# Patient Record
Sex: Female | Born: 2000 | Race: Black or African American | Hispanic: No | Marital: Single | Smoking: Never smoker
Health system: Southern US, Community
[De-identification: ages and names within clinical notes are randomized; demographics above are authoritative.]

## PROBLEM LIST (undated history)

## (undated) DIAGNOSIS — I471 Supraventricular tachycardia, unspecified: Secondary | ICD-10-CM

## (undated) HISTORY — DX: Supraventricular tachycardia, unspecified: I47.10

## (undated) HISTORY — DX: Supraventricular tachycardia: I47.1

---

## 2019-12-25 ENCOUNTER — Ambulatory Visit: Payer: Self-pay | Attending: Family

## 2019-12-25 DIAGNOSIS — Z23 Encounter for immunization: Secondary | ICD-10-CM

## 2019-12-25 NOTE — Progress Notes (Signed)
   Covid-19 Vaccination Clinic  Name:  Sydney Ramirez    MRN: [id number] DOB: [birth year]  12/25/2019  [name] was observed post Covid-19 immunization for 15 minutes without incident. She was provided with Vaccine Information Sheet and instruction to access the V-Safe system.   [name] was instructed to call 911 with any severe reactions post vaccine: Marland Kitchen Difficulty breathing  . Swelling of face and throat  . A fast heartbeat  . A bad rash all over body  . Dizziness and weakness   Immunizations Administered    Name Date Dose VIS Date Route   Moderna COVID-19 Vaccine 12/25/2019 11:47 AM 0.5 mL 08/26/2019 Intramuscular   Manufacturer: Moderna   Lot: [id number]   NDC: 00511-021-11

## 2020-01-27 ENCOUNTER — Ambulatory Visit: Payer: Self-pay | Attending: Family

## 2020-01-27 DIAGNOSIS — Z23 Encounter for immunization: Secondary | ICD-10-CM

## 2020-01-27 NOTE — Progress Notes (Signed)
   Covid-19 Vaccination Clinic  Name:  Sydney Ramirez    MRN: [id number] DOB: [birth year]  01/27/2020  Ms. Gross was observed post Covid-19 immunization for 15 minutes without incident. She was provided with Vaccine Information Sheet and instruction to access the V-Safe system.   [name] was instructed to call 911 with any severe reactions post vaccine: Marland Kitchen Difficulty breathing  . Swelling of face and throat  . A fast heartbeat  . A bad rash all over body  . Dizziness and weakness   Immunizations Administered    Name Date Dose VIS Date Route   Moderna COVID-19 Vaccine 01/27/2020 11:07 AM 0.5 mL 08/2019 Intramuscular   Manufacturer: Moderna   Lot: [id number]   NDC: 77824-235-36

## 2020-06-09 ENCOUNTER — Other Ambulatory Visit: Payer: Self-pay | Admitting: Nephrology

## 2020-06-09 DIAGNOSIS — R809 Proteinuria, unspecified: Secondary | ICD-10-CM

## 2020-06-09 DIAGNOSIS — Z841 Family history of disorders of kidney and ureter: Secondary | ICD-10-CM

## 2020-06-17 ENCOUNTER — Ambulatory Visit
Admission: RE | Admit: 2020-06-17 | Discharge: 2020-06-17 | Disposition: A | Discharge status: Home / Self Care | Payer: Self-pay | Source: Ambulatory Visit | Attending: Nephrology | Admitting: Nephrology

## 2020-06-17 DIAGNOSIS — Z841 Family history of disorders of kidney and ureter: Secondary | ICD-10-CM

## 2020-06-17 DIAGNOSIS — R809 Proteinuria, unspecified: Secondary | ICD-10-CM

## 2020-10-20 ENCOUNTER — Ambulatory Visit: Payer: BC Managed Care – PPO | Attending: Family

## 2020-10-20 DIAGNOSIS — Z23 Encounter for immunization: Secondary | ICD-10-CM

## 2020-11-20 ENCOUNTER — Emergency Department (HOSPITAL_COMMUNITY): Payer: BC Managed Care – PPO

## 2020-11-20 ENCOUNTER — Other Ambulatory Visit: Payer: Self-pay

## 2020-11-20 ENCOUNTER — Encounter (HOSPITAL_COMMUNITY): Payer: Self-pay | Admitting: Emergency Medicine

## 2020-11-20 ENCOUNTER — Emergency Department (HOSPITAL_COMMUNITY)
Admission: EM | Admit: 2020-11-20 | Discharge: 2020-11-20 | Disposition: A | Discharge status: Home / Self Care | Payer: BC Managed Care – PPO | Attending: Emergency Medicine | Admitting: Emergency Medicine

## 2020-11-20 DIAGNOSIS — R079 Chest pain, unspecified: Secondary | ICD-10-CM | POA: Diagnosis present

## 2020-11-20 DIAGNOSIS — I471 Supraventricular tachycardia: Secondary | ICD-10-CM | POA: Insufficient documentation

## 2020-11-20 DIAGNOSIS — R9431 Abnormal electrocardiogram [ECG] [EKG]: Secondary | ICD-10-CM

## 2020-11-20 LAB — BASIC METABOLIC PANEL
Anion gap: 15 (ref 5–15)
BUN: 10 mg/dL (ref 6–20)
CO2: 17 mmol/L — ABNORMAL LOW (ref 22–32)
Calcium: 9.3 mg/dL (ref 8.9–10.3)
Chloride: 104 mmol/L (ref 98–111)
Creatinine, Ser: 1.09 mg/dL — ABNORMAL HIGH (ref 0.44–1.00)
GFR, Estimated: >60 mL/min (ref >60)
Glucose, Bld: 99 mg/dL (ref 70–99)
Potassium: 3.8 mmol/L (ref 3.5–5.1)
Sodium: 136 mmol/L (ref 135–145)

## 2020-11-20 LAB — CBC
HCT: 41.9 % (ref 36.0–46.0)
Hemoglobin: 13.5 g/dL (ref 12.0–15.0)
MCH: 28.5 pg (ref 26.0–34.0)
MCHC: 32.2 g/dL (ref 30.0–36.0)
MCV: 88.4 fL (ref 80.0–100.0)
Platelets: 367 10*3/uL (ref 150–400)
RBC: 4.74 MIL/uL (ref 3.87–5.11)
RDW: 14 % (ref 11.5–15.5)
WBC: 9.3 10*3/uL (ref 4.0–10.5)
nRBC: 0 % (ref 0.0–0.2)

## 2020-11-20 LAB — TSH: TSH: 2.252 u[IU]/mL (ref 0.350–4.500)

## 2020-11-20 LAB — I-STAT BETA HCG BLOOD, ED (MC, WL, AP ONLY): I-stat hCG, quantitative: <5 m[IU]/mL (ref <5)

## 2020-11-20 LAB — TROPONIN I (HIGH SENSITIVITY): Troponin I (High Sensitivity): 16 ng/L (ref <18)

## 2020-11-20 MED ORDER — ADENOSINE 6 MG/2ML IV SOLN
INTRAVENOUS | Status: AC
  Filled 2020-11-20: qty 4

## 2020-11-20 MED ORDER — ADENOSINE 6 MG/2ML IV SOLN
6.0000 mg | Freq: Once | INTRAVENOUS | Status: DC
  Filled 2020-11-20: qty 2

## 2020-11-20 NOTE — Discharge Instructions (Addendum)
It was our pleasure to provide your ER care today - we hope that you feel better.  Minimize caffeine use.   Follow up with cardiologist in the coming week - call office Monday AM to arrange appointment.   Return to ER right away if worse, new symptoms, fevers, chest pain, trouble breathing, persistent fast heart beat, fainting, or other concern.

## 2020-11-20 NOTE — ED Notes (Addendum)
Pt resting comfortably. Respirations even and unlabored.  

## 2020-11-20 NOTE — ED Provider Notes (Signed)
[facility] EMERGENCY DEPARTMENT Provider Note   CSN: [id number] Arrival date & time: 11/20/20  0827     History Chief Complaint  Patient presents with  . Chest Pain  . SVT    Sydney Ramirez is a 20 y.o. female.  Patient c/o acute onset palpitations this AM, at rest, moderate-severe, constant, persistent. No syncope.  Felt mildly tight in chest and mildly sob. Denies hx similar symptoms in past. No hx syncope. States recently started exercising, and denies any symptoms with exercise. Denies personal or fam hx heart disease/problems. States yesterday felt fine, normal day. Normal po intake. No recent wt change. No heat intolerance or sweats. No fevers. No hx thyroid disease or other medical illness. Denies any drug use. No heavy caffeine use or energy drink use. Denies leg pain or swelling.   The history is provided by the patient.  Chest Pain Associated symptoms: palpitations   Associated symptoms: no abdominal pain, no back pain, no cough, no fever, no headache and no vomiting        History reviewed. No pertinent past medical history.  There are no problems to display for this patient.   History reviewed. No pertinent surgical history.   OB History   No obstetric history on file.     No family history on file.  Social History   Tobacco Use  . Smoking status: Never Smoker  . Smokeless tobacco: Never Used  Substance Use Topics  . Alcohol use: Not Currently  . Drug use: Not Currently    Home Medications Prior to Admission medications   Not on File    Allergies    Patient has no allergy information on record.  Review of Systems   Review of Systems  Constitutional: Negative for chills and fever.  HENT: Negative for sore throat.   Eyes: Negative for visual disturbance.  Respiratory: Negative for cough.   Cardiovascular: Positive for chest pain and palpitations. Negative for leg swelling.  Gastrointestinal: Negative for abdominal pain,  blood in stool, diarrhea and vomiting.  Genitourinary: Negative for flank pain and vaginal bleeding.  Musculoskeletal: Negative for back pain and neck pain.  Skin: Negative for rash.  Neurological: Negative for syncope and headaches.  Hematological: Does not bruise/bleed easily.  Psychiatric/Behavioral: Negative for confusion.    Physical Exam Updated Vital Signs BP 90/64   Pulse 70   Temp 98.6 F (37 C) (Oral)   Resp 15   SpO2 100%   Physical Exam Vitals and nursing note reviewed.  Constitutional:      Appearance: Normal appearance. She is well-developed.  HENT:     Head: Atraumatic.     Nose: Nose normal.     Mouth/Throat:     Mouth: Mucous membranes are moist.  Eyes:     General: No scleral icterus.    Conjunctiva/sclera: Conjunctivae normal.     Pupils: Pupils are equal, round, and reactive to light.  Neck:     Trachea: No tracheal deviation.     Comments: Thyroid not grossly enlarged or tender.  Cardiovascular:     Rate and Rhythm: Regular rhythm. Tachycardia present.     Pulses: Normal pulses.     Heart sounds: Normal heart sounds. No murmur heard. No friction rub. No gallop.   Pulmonary:     Effort: Pulmonary effort is normal. No respiratory distress.     Breath sounds: Normal breath sounds.  Abdominal:     General: Bowel sounds are normal. There is no distension.  Palpations: Abdomen is soft.     Tenderness: There is no abdominal tenderness. There is no guarding.  Genitourinary:    Comments: No cva tenderness.  Musculoskeletal:        General: No swelling or tenderness.     Cervical back: Normal range of motion and neck supple. No rigidity. No muscular tenderness.     Right lower leg: No edema.     Left lower leg: No edema.  Skin:    General: Skin is warm and dry.     Findings: No rash.  Neurological:     Mental Status: She is alert.     Comments: Alert, speech normal. Steady gait.   Psychiatric:        Mood and Affect: Mood normal.     ED  Results / Procedures / Treatments   Labs (all labs ordered are listed, but only abnormal results are displayed) Results for orders placed or performed during the hospital encounter of 11/20/20  Basic metabolic panel  Result Value Ref Range   Sodium 136 135 - 145 mmol/L   Potassium 3.8 3.5 - 5.1 mmol/L   Chloride 104 98 - 111 mmol/L   CO2 17 (L) 22 - 32 mmol/L   Glucose, Bld 99 70 - 99 mg/dL   BUN 10 6 - 20 mg/dL   Creatinine, Ser 6.29 (H) 0.44 - 1.00 mg/dL   Calcium 9.3 8.9 - 52.8 mg/dL   GFR, Estimated >41 >32 mL/min   Anion gap 15 5 - 15  CBC  Result Value Ref Range   WBC 9.3 4.0 - 10.5 K/uL   RBC 4.74 3.87 - 5.11 MIL/uL   Hemoglobin 13.5 12.0 - 15.0 g/dL   HCT 44.0 10.2 - 72.5 %   MCV 88.4 80.0 - 100.0 fL   MCH 28.5 26.0 - 34.0 pg   MCHC 32.2 30.0 - 36.0 g/dL   RDW 36.6 44.0 - 34.7 %   Platelets 367 150 - 400 K/uL   nRBC 0.0 0.0 - 0.2 %  TSH  Result Value Ref Range   TSH 2.252 0.350 - 4.500 uIU/mL  I-Stat beta hCG blood, ED  Result Value Ref Range   I-stat hCG, quantitative <5.0 <5 mIU/mL   Comment 3          Troponin I (High Sensitivity)  Result Value Ref Range   Troponin I (High Sensitivity) 16 <18 ng/L   DG Chest Portable 1 View  Result Date: 11/20/2020 CLINICAL DATA:  Left-sided chest pain and shortness of breath with super intra-ocular tachycardia. EXAM: PORTABLE CHEST 1 VIEW COMPARISON:  None. FINDINGS: The heart size and mediastinal contours are within normal limits. No focal consolidation. No pleural effusion. No pneumothorax. The visualized skeletal structures are unremarkable. Defibrillator lead projects over left chest. IMPRESSION: No active disease. Electronically Signed   By: Maudry Mayhew MD   On: 11/20/2020 09:05    EKG EKG Interpretation  Date/Time:  Saturday November 20 2020 08:48:07 EST Ventricular Rate:  64 PR Interval:    QRS Duration: 77 QT Interval:  322 QTC Calculation: 333 R Axis:   72 Text Interpretation: Sinus rhythm Borderline short  PR interval Nonspecific ST abnormality Short QT interval Confirmed by Cathren Laine ([id number]) on 11/20/2020 9:02:49 AM   Radiology DG Chest Portable 1 View  Result Date: 11/20/2020 CLINICAL DATA:  Left-sided chest pain and shortness of breath with super intra-ocular tachycardia. EXAM: PORTABLE CHEST 1 VIEW COMPARISON:  None. FINDINGS: The heart size and mediastinal contours are within  normal limits. No focal consolidation. No pleural effusion. No pneumothorax. The visualized skeletal structures are unremarkable. Defibrillator lead projects over left chest. IMPRESSION: No active disease. Electronically Signed   By: Maudry Mayhew MD   On: 11/20/2020 09:05    Procedures Procedures   Medications Ordered in ED Medications  adenosine (ADENOCARD) 6 MG/2ML injection 6 mg (has no administration in time range)  adenosine (ADENOCARD) 6 MG/2ML injection (has no administration in time range)    ED Course  I have reviewed the triage vital signs and the nursing notes.  Pertinent labs & imaging results that were available during my care of the patient were reviewed by me and considered in my medical decision making (see chart for details).    MDM Rules/Calculators/A&P                         Iv ns. Adenosine ordered. Stat labs. Continuous pulse ox and cardiac monitoring.   Reviewed nursing notes and prior charts for additional history.   Prior to adenosine being given, vagal maneuvers tried with conversation to sinus rhythm, and resolution of palpitations and all symptoms.   Labs reviewed/interpreted by me - k normal, tsh normal, trop normal.  CXR reviewed/interpreted by me - no cm, no pna.   Recheck pt, remains asymptomatic in NSR. No cp or sob. No faintness or dizziness. Po fluids. Ambulate in hall.  Pt asymptomatic and appears stable for d/c.   Rec cardiology f/u in coming week.  Return precautions provided.       Final Clinical Impression(s) / ED Diagnoses Final diagnoses:  None     Rx / DC Orders ED Discharge Orders    None       Cathren Laine, MD 11/20/20 1103

## 2020-11-20 NOTE — ED Triage Notes (Signed)
Pt reports L sided chest pain and SOB that woke her up at 6:30am.  SVT @ 223.

## 2020-11-20 NOTE — ED Notes (Signed)
Patient Alert and oriented to baseline. Stable and ambulatory to baseline. Patient verbalized understanding of the discharge instructions.  Patient belongings were taken by the patient.   

## 2020-11-20 NOTE — ED Notes (Signed)
Pt given graham crackers and apple juice  

## 2020-11-20 NOTE — ED Notes (Signed)
Pt attempted vagal maneuvers with conversion to NSR. ekg captured.

## 2020-11-23 ENCOUNTER — Ambulatory Visit (INDEPENDENT_AMBULATORY_CARE_PROVIDER_SITE_OTHER): Payer: BC Managed Care – PPO | Admitting: Cardiology

## 2020-11-23 ENCOUNTER — Encounter: Payer: Self-pay | Admitting: Cardiology

## 2020-11-23 ENCOUNTER — Other Ambulatory Visit: Payer: Self-pay

## 2020-11-23 VITALS — BP 110/60 | HR 90 | Ht 64.0 in | Wt 159.4 lb

## 2020-11-23 DIAGNOSIS — I471 Supraventricular tachycardia, unspecified: Secondary | ICD-10-CM | POA: Insufficient documentation

## 2020-11-23 NOTE — Patient Instructions (Signed)
Medication Instructions:  Your physician recommends that you continue on your current medications as directed. Please refer to the Current Medication list given to you today.  *If you need a refill on your cardiac medications before your next appointment, please call your pharmacy*  Testing/Procedures: Your physician has requested that you have an echocardiogram. Echocardiography is a painless test that uses sound waves to create images of your heart. It provides your doctor with information about the size and shape of your heart and how well your heart's chambers and valves are working. This procedure takes approximately one hour. There are no restrictions for this procedure.  Follow-Up: At [facility], you and your health needs are our priority.  As part of our continuing mission to provide you with exceptional heart care, we have created designated Provider Care Teams.  These Care Teams include your primary Cardiologist (physician) and Advanced Practice Providers (APPs -  Physician Assistants and Nurse Practitioners) who all work together to provide you with the care you need, when you need it.    Follow up with Dr. Mayford Knife as needed based on results of testing   Other Instructions You have been referred to see an electrophysiologist, Dr. Lalla Brothers

## 2020-11-23 NOTE — Addendum Note (Signed)
Addended by: Theresia Majors on: 11/23/2020 09:15 AM   Modules accepted: Orders

## 2020-11-23 NOTE — Progress Notes (Signed)
Cardiology Consult  Note    Date:  11/23/2020   ID:  [name], DOB Feb 22, 2001, MRN [id number]  PCP:  Patient, No Pcp Per  Cardiologist:  Armanda Magic, MD   Chief Complaint  Patient presents with  . New Patient (Initial Visit)    SVT    History of Present Illness:  Jenissa Tyrell is a 20 y.o. female who is being seen today for the evaluation of SVT at the request of Cathren Laine, MD.  This is a 20yo female with no PMH who presented to [facility] ER with complaints of palpitations and was dx with SVT.  She had sudden onset of palpitations associated with mild chest tightness and SOB.  She has never had this before.  She exercises with no problems.  No hx of thyroid disease and denies any ETOH use, tobacco, caffeine or energy drink use.  TSH was normal and other labs were normal as well.  EKG showed SVT at 224bpm.  Vagal maneuvers were tried and she spontaneously converted to NSR. After conversion to NSR her EKG showed NSR with borderline short PR interval.    Past Medical History:  Diagnosis Date  . SVT (supraventricular tachycardia) (HCC)     No past surgical history on file.  Current Medications: Current Meds  Medication Sig  . Cholecalciferol (VITAMIN D3 PO) Take 1 tablet by mouth daily.  Kathrynn Running Estrad-Fe Biphas (LO LOESTRIN FE PO) Take 1 tablet by mouth daily.    Allergies:   Iodine and Shellfish allergy   Social History   Socioeconomic History  . Marital status: Single    Spouse name: Not on file  . Number of children: Not on file  . Years of education: Not on file  . Highest education level: Not on file  Occupational History  . Not on file  Tobacco Use  . Smoking status: Never Smoker  . Smokeless tobacco: Never Used  Substance and Sexual Activity  . Alcohol use: Not Currently  . Drug use: Not Currently  . Sexual activity: Not on file  Other Topics Concern  . Not on file  Social History Narrative  . Not on file   Social Determinants of Health    Financial Resource Strain: Not on file  Food Insecurity: Not on file  Transportation Needs: Not on file  Physical Activity: Not on file  Stress: Not on file  Social Connections: Not on file     Family History:  The patient's family history is not on file.   ROS:   Please see the history of present illness.    ROS All other systems reviewed and are negative.  No flowsheet data found.  PHYSICAL EXAM:   VS:  BP 110/60   Pulse 90   Ht 5\' 4"  (1.626 m)   Wt 159 lb 6.4 oz (72.3 kg)   SpO2 98%   BMI 27.36 kg/m    GEN: Well nourished, well developed, in no acute distress  HEENT: normal  Neck: no JVD, carotid bruits, or masses Cardiac: RRR; no murmurs, rubs, or gallops,no edema.  Intact distal pulses bilaterally.  Respiratory:  clear to auscultation bilaterally, normal work of breathing GI: soft, nontender, nondistended, + BS MS: no deformity or atrophy  Skin: warm and dry, no rash Neuro:  Alert and Oriented x 3, Strength and sensation are intact Psych: euthymic mood, full affect  Wt Readings from Last 3 Encounters:  11/23/20 159 lb 6.4 oz (72.3 kg)     Studies/Labs Reviewed:  EKG:  EKG is not ordered today.   Recent Labs: 11/20/2020: BUN 10; Creatinine, Ser 1.09; Hemoglobin 13.5; Platelets 367; Potassium 3.8; Sodium 136; TSH 2.252   Lipid Panel No results found for: CHOL, TRIG, HDL, CHOLHDL, VLDL, LDLCALC, LDLDIRECT   Additional studies/ records that were reviewed today include:  ER notes, labs and EKG  ASSESSMENT:    1. SVT (supraventricular tachycardia) (HCC)      PLAN:  In order of problems listed above:  1. SVT -EKG on arrival in ER consistent with SVT -terminated with vagal maneuvers -I discussed the different vagal maneuvers she could use to terminate this -will refer to Dr. Lalla Brothers with EP to follow as ablation may be considered since BP is soft and her BP was in the upper 90's in the ER   Medication Adjustments/Labs and Tests Ordered: Current  medicines are reviewed at length with the patient today.  Concerns regarding medicines are outlined above.  Medication changes, Labs and Tests ordered today are listed in the Patient Instructions below.  There are no Patient Instructions on file for this visit.   Signed, Armanda Magic, MD  11/23/2020 9:06 AM    [facility] [address] [place], [place], [place]  [address] Phone: [phone]; Fax: [phone]

## 2020-12-16 ENCOUNTER — Ambulatory Visit (HOSPITAL_COMMUNITY): Payer: BC Managed Care – PPO | Attending: Cardiovascular Disease

## 2020-12-16 ENCOUNTER — Other Ambulatory Visit: Payer: Self-pay

## 2020-12-16 DIAGNOSIS — I471 Supraventricular tachycardia: Secondary | ICD-10-CM | POA: Insufficient documentation

## 2020-12-16 LAB — ECHOCARDIOGRAM COMPLETE
Area-P 1/2: 3.99 cm2
S' Lateral: 2.8 cm

## 2020-12-28 ENCOUNTER — Encounter: Payer: Self-pay | Admitting: Cardiology

## 2020-12-28 ENCOUNTER — Ambulatory Visit (INDEPENDENT_AMBULATORY_CARE_PROVIDER_SITE_OTHER): Payer: BC Managed Care – PPO | Admitting: Cardiology

## 2020-12-28 ENCOUNTER — Other Ambulatory Visit: Payer: Self-pay

## 2020-12-28 VITALS — BP 98/62 | HR 80 | Ht 64.0 in | Wt 157.2 lb

## 2020-12-28 DIAGNOSIS — I471 Supraventricular tachycardia: Secondary | ICD-10-CM | POA: Diagnosis not present

## 2020-12-28 NOTE — Progress Notes (Addendum)
Electrophysiology Office Note:    Date:  12/28/2020   ID:  Sydney Ramirez, DOB 04/30/2001, MRN [id number]  PCP:  Patient, No Pcp Per (Inactive)  [facility] Cardiologist:  Armanda Magic, MD  [facility] Electrophysiologist:  None   Referring MD: Quintella Reichert, MD   Chief Complaint: SVT  History of Present Illness:    Sydney Ramirez is a 20 y.o. female who presents for an evaluation of SVT at the request of Dr. Mayford Knife.  She has no significant past medical history.  She was seen in the emergency department February 20 6-20 22 with SVT that ultimately converted with vagal maneuvers.  Patient tells me that she was awakened from sleep with chest discomfort and a rapid heartbeat.  She went to urgent care who then referred her to the emergency department where she was found to be in SVT.  She was instructed on how to perform a vagal maneuver which readily terminated the rhythm.  She has not had a recurrence since that time.  There is no history of sudden cardiac death in her family.  No drowning.  No defibrillators.  Her father was on the phone during today's appointment.   Past Medical History:  Diagnosis Date  . SVT (supraventricular tachycardia) (HCC)     No past surgical history on file.  Current Medications: Current Meds  Medication Sig  . Cholecalciferol (VITAMIN D3 PO) Take 1 tablet by mouth daily.  Kathrynn Running Estrad-Fe Biphas (LO LOESTRIN FE PO) Take 1 tablet by mouth daily.     Allergies:   Iodine and Shellfish allergy   Social History   Socioeconomic History  . Marital status: Single    Spouse name: Not on file  . Number of children: Not on file  . Years of education: Not on file  . Highest education level: Not on file  Occupational History  . Not on file  Tobacco Use  . Smoking status: Never Smoker  . Smokeless tobacco: Never Used  Substance and Sexual Activity  . Alcohol use: Not Currently  . Drug use: Not Currently  . Sexual activity: Not  on file  Other Topics Concern  . Not on file  Social History Narrative  . Not on file   Social Determinants of Health   Financial Resource Strain: Not on file  Food Insecurity: Not on file  Transportation Needs: Not on file  Physical Activity: Not on file  Stress: Not on file  Social Connections: Not on file     Family History: The patient's family history is not on file.  ROS:   Please see the history of present illness.    All other systems reviewed and are negative.  EKGs/Labs/Other Studies Reviewed:    The following studies were reviewed today:  December 16, 2020 echo personally reviewed Left ventricular function normal, 60% Right ventricular function normal No significant valvular abnormalities  November 20, 2020 EKG Sinus rhythm.  PR is short.  There is a suggestion of subtle preexcitation in the precordium and inferior leads.  Delta wave appears nearly isoelectric in lead V1 and lead I.  It is positive in the inferior leads with R greater than S in lead III.   November 20, 2020 arrival EKG Narrow complex tachycardia        EKG:  The ekg ordered today demonstrates sinus rhythm with a ventricular rate of 80 bpm.  There is a clear PR isoelectric segment on today's EK.  There is very subtle preexcitation in the  inferior leads.  Preexcitation is significantly less than the EKG on February 26.  Recent Labs: 11/20/2020: BUN 10; Creatinine, Ser 1.09; Hemoglobin 13.5; Platelets 367; Potassium 3.8; Sodium 136; TSH 2.252  Recent Lipid Panel No results found for: CHOL, TRIG, HDL, CHOLHDL, VLDL, LDLCALC, LDLDIRECT  Physical Exam:    VS:  BP 98/62   Pulse 80   Ht 5\' 4"  (1.626 m)   Wt 157 lb 3.2 oz (71.3 kg)   SpO2 97%   BMI 26.98 kg/m     Wt Readings from Last 3 Encounters:  12/28/20 157 lb 3.2 oz (71.3 kg)  11/23/20 159 lb 6.4 oz (72.3 kg)     GEN:  Well nourished, well developed in no acute distress HEENT: Normal NECK: No JVD; No carotid bruits LYMPHATICS:  No lymphadenopathy CARDIAC: RRR, no murmurs, rubs, gallops RESPIRATORY:  Clear to auscultation without rales, wheezing or rhonchi  ABDOMEN: Soft, non-tender, non-distended MUSCULOSKELETAL:  No edema; No deformity  SKIN: Warm and dry NEUROLOGIC:  Alert and oriented x 3 PSYCHIATRIC:  Normal affect   ASSESSMENT:    1. SVT (supraventricular tachycardia) (HCC)    PLAN:    In order of problems listed above:  1. Narrow complex tachycardia Responsive to vagal maneuvers.  There is a suggestion of preexcitation on her baseline EKG.  The delta wave morphology suggest an anteroseptal location.  Differential for her SVT includes AVNRT with bystander accessory pathway versus an orthodromic AVRT.  I discussed management options for her SVT including conservative management versus EP study and ablation.  Given the likely location of her accessory pathway in the anterior septum and the intermittent preexcitation now observed on her EKGs, I think a more conservative approach is justified.  We will plan to touch base in about 6 months.  If she begins having more episodes of SVT she has a way to reach out to our clinic and we will bring her in sooner for an appointment to consider medical therapy versus EP study with possible ablation.     Total time spent with patient today 55 minutes. This includes reviewing records, evaluating the patient and coordinating care.  Medication Adjustments/Labs and Tests Ordered: Current medicines are reviewed at length with the patient today.  Concerns regarding medicines are outlined above.  Orders Placed This Encounter  Procedures  . EKG 12-Lead   No orders of the defined types were placed in this encounter.    Signed, 01/23/21, MD, [facility]  12/28/2020 9:31 AM    Electrophysiology [facility]

## 2020-12-28 NOTE — Patient Instructions (Addendum)
Medication Instructions:  Your physician recommends that you continue on your current medications as directed. Please refer to the Current Medication list given to you today.  Labwork: None ordered.  Testing/Procedures: None ordered.  Follow-Up: Your physician wants you to follow-up in: 6 months with Cameron Lambert, MD   Any Other Special Instructions Will Be Listed Below (If Applicable).  If you need a refill on your cardiac medications before your next appointment, please call your pharmacy.         

## 2021-02-10 NOTE — Progress Notes (Signed)
   Covid-19 Vaccination Clinic  Name:  Sydney Ramirez    MRN: [id number] DOB: 15-Jul-2001  02/10/2021  [name] was observed post Covid-19 immunization for 15 minutes without incident. She was provided with Vaccine Information Sheet and instruction to access the V-Safe system.   [name] was instructed to call 911 with any severe reactions post vaccine: Marland Kitchen Difficulty breathing  . Swelling of face and throat  . A fast heartbeat  . A bad rash all over body  . Dizziness and weakness   Immunizations Administered    Name Date Dose VIS Date Route   Moderna Covid-19 Booster Vaccine 10/20/2020 10:15 AM 0.25 mL 07/14/2020 Intramuscular   Manufacturer: Moderna   Lot: [id number]   NDC: 62229-798-92

## 2022-04-24 IMAGING — DX DG CHEST 1V PORT
1 series · 1 of 1 positions shown · non-contrast
Comparison: None.

CLINICAL DATA: Left-sided chest pain and shortness of breath with
super intra-ocular tachycardia.

EXAM:
PORTABLE CHEST 1 VIEW

[chest ap]
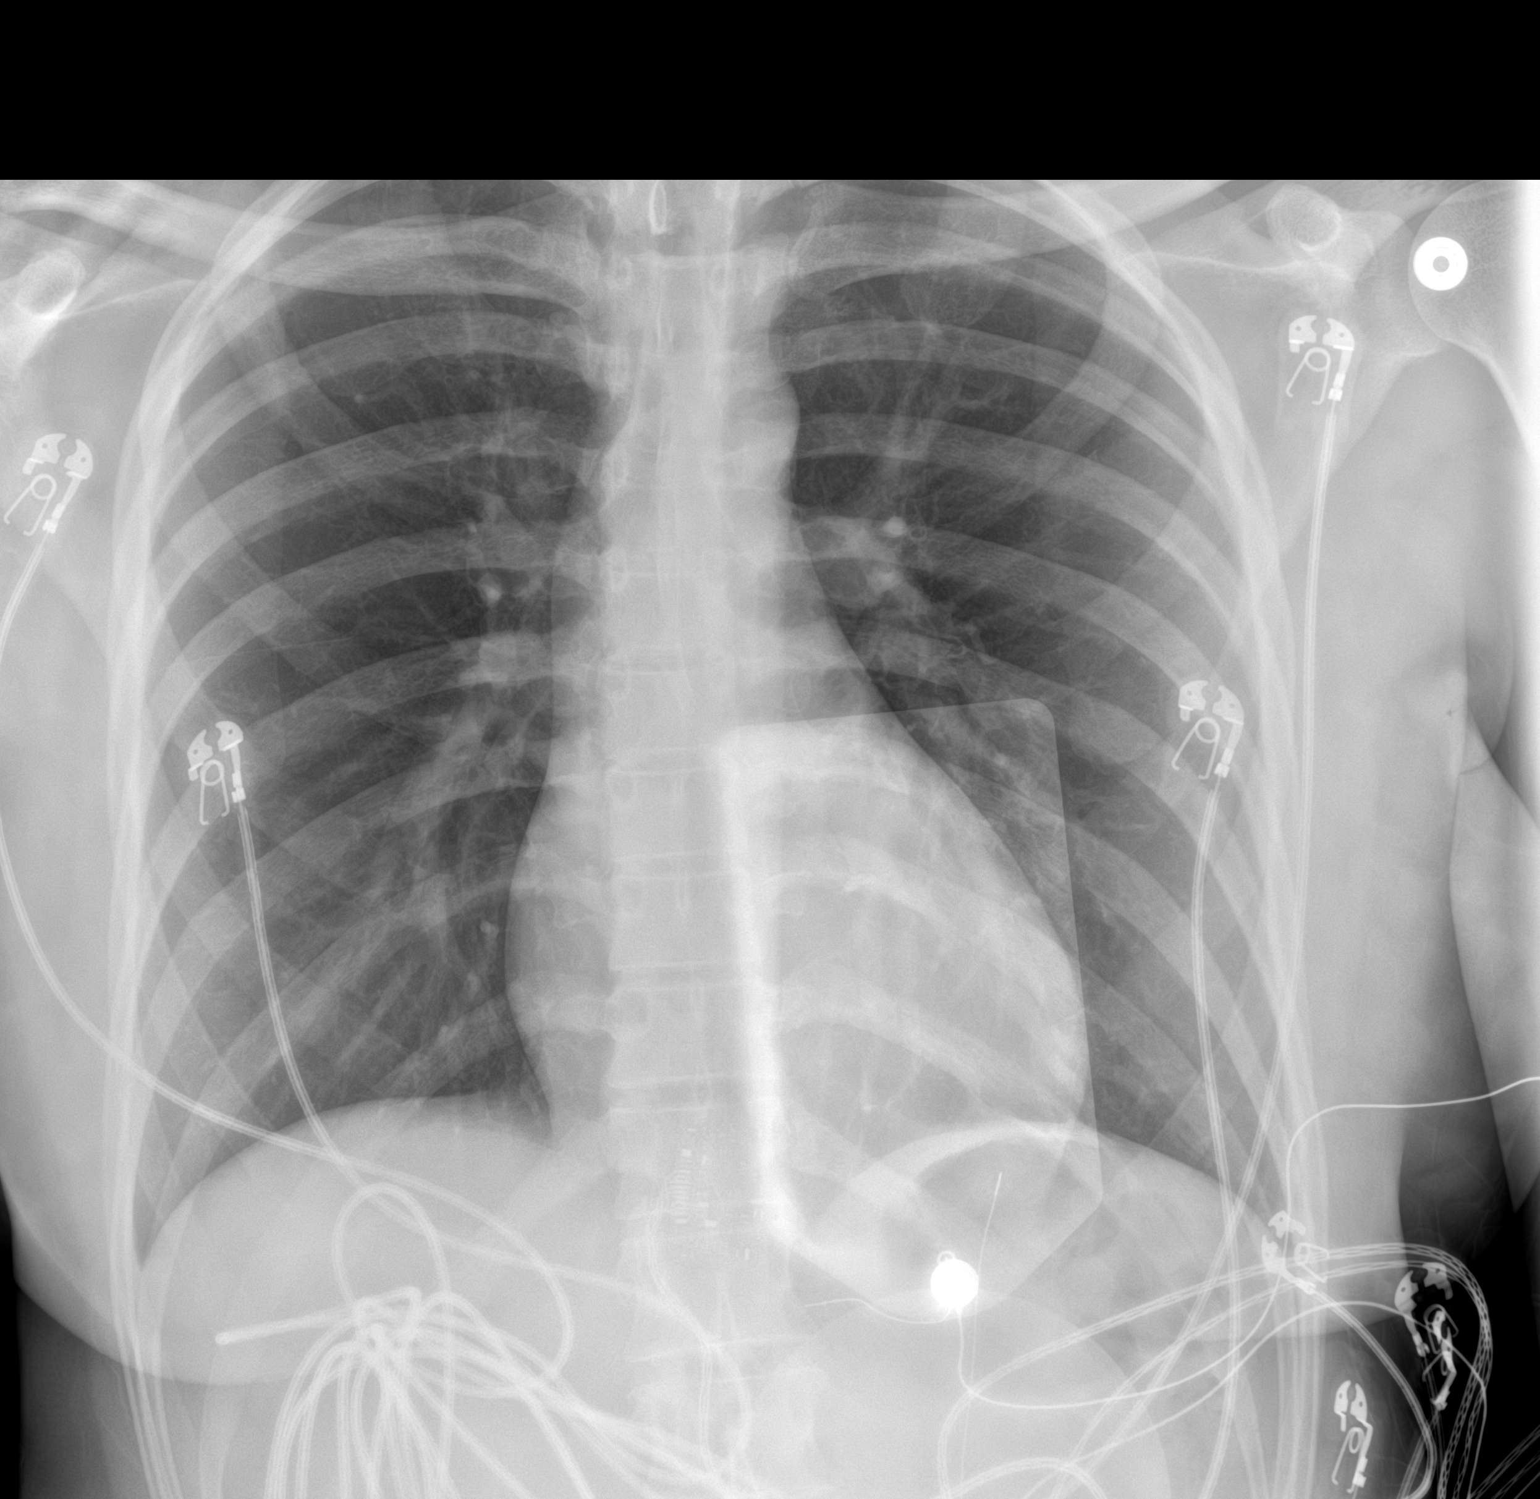

[1 of 1 positions shown; findings below may reference images not displayed]

FINDINGS: The heart size and mediastinal contours are within normal limits. No
focal consolidation. No pleural effusion. No pneumothorax. The
visualized skeletal structures are unremarkable. Defibrillator lead
projects over left chest.
IMPRESSION: No active disease.
